# Patient Record
Sex: Female | Born: 1965 | Race: White | Hispanic: No | Marital: Married | Smoking: Former smoker
Health system: Southern US, Community
[De-identification: ages and names within clinical notes are randomized; demographics above are authoritative.]

## PROBLEM LIST (undated history)

## (undated) DIAGNOSIS — F419 Anxiety disorder, unspecified: Secondary | ICD-10-CM

## (undated) HISTORY — DX: Anxiety disorder, unspecified: F41.9

---

## 1998-11-28 ENCOUNTER — Other Ambulatory Visit: Admission: RE | Admit: 1998-11-28 | Discharge: 1998-11-28 | Payer: Self-pay | Admitting: Obstetrics and Gynecology

## 1999-07-13 ENCOUNTER — Inpatient Hospital Stay (HOSPITAL_COMMUNITY): Admission: AD | Admit: 1999-07-13 | Discharge: 1999-07-15 | Payer: Self-pay | Admitting: Obstetrics and Gynecology

## 1999-08-09 ENCOUNTER — Encounter: Admission: RE | Admit: 1999-08-09 | Discharge: 1999-11-07 | Payer: Self-pay | Admitting: Obstetrics and Gynecology

## 1999-12-28 ENCOUNTER — Other Ambulatory Visit: Admission: RE | Admit: 1999-12-28 | Discharge: 1999-12-28 | Payer: Self-pay | Admitting: Obstetrics and Gynecology

## 2001-01-06 ENCOUNTER — Other Ambulatory Visit: Admission: RE | Admit: 2001-01-06 | Discharge: 2001-01-06 | Payer: Self-pay | Admitting: Obstetrics and Gynecology

## 2002-01-08 ENCOUNTER — Other Ambulatory Visit: Admission: RE | Admit: 2002-01-08 | Discharge: 2002-01-08 | Payer: Self-pay | Admitting: Obstetrics and Gynecology

## 2003-03-03 ENCOUNTER — Ambulatory Visit (HOSPITAL_COMMUNITY): Admission: RE | Admit: 2003-03-03 | Discharge: 2003-03-03 | Payer: Self-pay | Admitting: Orthopedic Surgery

## 2003-09-15 ENCOUNTER — Other Ambulatory Visit: Admission: RE | Admit: 2003-09-15 | Discharge: 2003-09-15 | Payer: Self-pay | Admitting: Obstetrics and Gynecology

## 2004-07-27 ENCOUNTER — Ambulatory Visit: Payer: Self-pay | Admitting: Gastroenterology

## 2004-08-14 ENCOUNTER — Ambulatory Visit: Payer: Self-pay | Admitting: Gastroenterology

## 2004-08-15 ENCOUNTER — Encounter: Admission: RE | Admit: 2004-08-15 | Discharge: 2004-08-15 | Payer: Self-pay | Admitting: Gastroenterology

## 2005-01-01 ENCOUNTER — Other Ambulatory Visit: Admission: RE | Admit: 2005-01-01 | Discharge: 2005-01-01 | Payer: Self-pay | Admitting: Obstetrics and Gynecology

## 2007-01-13 ENCOUNTER — Encounter (INDEPENDENT_AMBULATORY_CARE_PROVIDER_SITE_OTHER): Payer: Self-pay | Admitting: Obstetrics and Gynecology

## 2007-01-14 ENCOUNTER — Inpatient Hospital Stay (HOSPITAL_COMMUNITY): Admission: RE | Admit: 2007-01-14 | Discharge: 2007-01-15 | Payer: Self-pay | Admitting: Obstetrics and Gynecology

## 2010-11-27 NOTE — Op Note (Signed)
NAME:  Tanya Hall, Tanya Hall NO.:  [id number]   MEDICAL RECORD NO.:  [id number]          PATIENT TYPE:  AMB   LOCATION:  SDC                           FACILITY:  [facility]   PHYSICIAN:  Miguel Aschoff, M.D.       DATE OF BIRTH:  09-02-1965   DATE OF PROCEDURE:  01/13/2007  DATE OF DISCHARGE:                               OPERATIVE REPORT   PREOPERATIVE DIAGNOSIS:  Complex left adnexal mass, chronic pelvic pain,  dysmenorrhea.   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   PROCEDURE:  Diagnostic laparoscopy, left salpingo-oophorectomy, cautery  of endometriosis, laser uterosacral nerve ablation.   SURGEON:  Miguel Aschoff, M.D.   ASSISTANT:  Ilda Mori, M.D.   ANESTHESIA:  General.   COMPLICATIONS:  None.   JUSTIFICATION:  The patient is a 45 year old white female with a history  of persistent pelvic pain and increasing dysmenorrhea.  Ultrasound  revealed a complex adnexal mass involving the left ovary. This has been  observed and has been persistent and the patient's pelvic pain has also  persisted. Due to these findings, she presents now to undergo  laparoscopy and laparotomy as indicated in an effort to determine the  cause of the patient's pelvic pain and complex left adnexal mass.  The  risks and benefits of the procedure were discussed with the patient.   DESCRIPTION OF PROCEDURE:  The patient was taken to the operating room  and placed in a supine position.  General anesthesia was administered  without difficulty.  She was then placed in the dorsal lithotomy  position, prepped and draped in the usual sterile fashion.  A Foley  catheter was inserted.  A Hulka tenaculum was then placed through the  cervix and held.  Attention was then directed to the umbilicus where a  small infraumbilical incision was made.  A Veress needle was inserted  and the abdomen was insufflated with 3 liters CO2.  Following  insufflation, the trocar to  the laparoscope was placed followed by the  laparoscope, itself.  Then, under direct visualization, an 11 mm port  was established in the left lower quadrant and a 5 mm port was  established in the right lower quadrant.  Systematic inspection of the  pelvic organs showed normal anterior bladder peritoneum.  The round  ligaments were unremarkable.  The uterus was noted to be mid position,  normal size and shape.  The tubes were identified and were normal along  their course.  The fimbriae were fine and delicate.  The right ovary was  inspected and appeared to be normal size.  There appeared to be two  powder burn lesions on the surface of the right ovary.  The left tube  was noted to be normal along its course with normal fimbria.  The left  ovary, however, was noted to be adherent to the lateral pelvic sidewall  and enlarged and the enlargement of the ovary was found to be secondary  to endometriomas involving the ovary.  It was possible to free the ovary  off the lateral pelvic sidewall, the ovary  was then elevated and the  infundibulopelvic ligament was identified as was the ureter. Then, using  the gyrus unit, it was possible to dissect the ovary medially towards  the uterus along the meso-ovarium ligament.  The infundibulopelvic  ligament was identified, cauterized, and cut.  Then, the meso-ovarian  ligament was cauterized and cut in a similar fashion. The mesosalpinx  was also incorporated, cauterized and cut, and the specimen dissected  toward the cornual portion of the uterus.  Then, the pedicle holding the  residual portion of the tube and ovary was grasped with the gyrus unit,  cauterized, cut, freeing the specimen.  At this point, the tube and  ovaries were placed in the anterior cul-de-sac.  Because of the  patient's dysmenorrhea, it was elected to laser ablate the uterosacral  ligaments in an effort to relieve the dysmenorrhea. Using the YAG laser  at 15 watts of power and a  GRP6 tip, the uterosacral ligaments were  partially transected.  After this was done, the implants of  endometriosis on the right ovary were treated with laser without  difficulty.  At this point, the abdomen was irrigated with saline via  the Nezhat suction irrigator.  There appeared to be good hemostasis.  There was one small area of oozing noted where the ovary was adherent to  the lateral pelvic sidewall.  This was adjacent to where the ureter was  located and this was brought under control by placing Surgicel on this  location.  At this point, the specimen was placed in an EndoCatch bag,  brought out through the left lower quadrant incision.  The fascial  incision was extended and the specimen was removed en toto.  After this  was done, lap counts and instrument counts were taken and found to be  correct.  All instruments were removed.  The fascia in the left lower  quadrant incision was then grasped with Kocher clamps and then closed  using running interlocking 0 Vicryl suture.  The subcutaneous tissue in  this area was closed using interrupted 0 Vicryl suture and then all skin  incisions were closed using subcuticular 4-0 Vicryl.  The port sites  were then injected with 0.25% Marcaine.  The estimated blood loss was  approximately 50 mL.  The patient tolerated the procedure well and went  to the recovery room in satisfactory condition.      Miguel Aschoff, M.D.  Electronically Signed     AR/MEDQ  D:  01/13/2007  T:  01/13/2007  Job:  [id number]

## 2010-11-30 NOTE — Discharge Summary (Signed)
Tanya Hall, THREATS NO.:  [id number]   MEDICAL RECORD NO.:  [id number]          PATIENT TYPE:  INP   LOCATION:  9319                          FACILITY:  [facility]   PHYSICIAN:  Miguel Aschoff, M.D.       DATE OF BIRTH:  May 04, 1966   DATE OF ADMISSION:  01/13/2007  DATE OF DISCHARGE:  01/15/2007                               DISCHARGE SUMMARY   ADMISSION DIAGNOSIS:  Complex left adnexal mass, chronic pelvic pain,  dysmenorrhea.   FINAL DIAGNOSIS:  Pelvic endometriosis with an endometrioma involving  the left ovary.   OPERATIONS AND PROCEDURES:  Laparoscopy with left salpingo-oophorectomy,  quartered endometriosis and laser uterosacral nerve ablation.   BRIEF HISTORY:  The patient is a 45 year old white female with  persistent pelvic pain and dysmenorrhea who was found to have a  persistent complex left adnexal mass involving the left ovary.  Because  of the ultrasound features of this mass in the persistence and  associated symptoms of pelvic pain and dysmenorrhea, the diagnostic  options were discussed with the patient and the patient agreed to  undergo laparoscopy with left salpingo-oophorectomy.  For suspected  endometrioma.   HOSPITAL COURSE:  The patient had preoperative studies obtained.  This  include admission hemoglobin 14.4, white count of 5400.  She was taken  to the operating room on January 13, 2007 where under general anesthesia,  diagnostic laparoscopy was carried out without difficulty and left  salpingo-oophorectomy was carried out with removal of a endometriotic  cyst involving the left ovary.  Surgery was  carried out without  difficulty.  Residual implants of endometriosis were treated with  cautery and the patient underwent laser uterosacral nerve ablation in an  effort to try to reduce her dysmenorrhea.  Postoperative course was  essentially uncomplicated.  She did require IV pain medication.  However, by January 15, 2007 she was in satisfactory  condition, ambulating  well and she began a regular diet.  She was felt to be in satisfactory  condition to be discharged home.   MEDICATIONS:  For home included Darvocet N 100 one every 4 hours as  needed for pain, Motrin 800 mg one p.o. t.i.d. p.r.n. pain.   She was sent home on a regular diet, instructed no heavy lifting, place  nothing in vagina, to call for any problems such as fever, pain or heavy  bleeding.  The patient is scheduled to be seen back in 4 weeks for  follow-up examination.   FINAL DIAGNOSIS:  Pelvic endometriosis with endometrioma of left ovary.   CONDITION ON DISCHARGE:  Improved.   REVIEW OF SYSTEMS:  Thank you      Miguel Aschoff, M.D.  Electronically Signed     AR/MEDQ  D:  03/07/2007  T:  03/08/2007  Job:  [id number]

## 2010-11-30 NOTE — Op Note (Signed)
NAME:  Tanya Hall, Tanya Hall                       ACCOUNT NO.:  [id number]   MEDICAL RECORD NO.:  [id number]                   PATIENT TYPE:  AMB   LOCATION:  DAY                                  FACILITY:  [facility]   PHYSICIAN:  Marlowe Kays, M.D.               DATE OF BIRTH:  1965-12-19   DATE OF PROCEDURE:  03/03/2003  DATE OF DISCHARGE:                                 OPERATIVE REPORT   PREOPERATIVE DIAGNOSES:  Torn medial and possibly torn lateral meniscus,  left knee.   POSTOPERATIVE DIAGNOSES:  Torn medial and lateral menisci, left knee.   OPERATION:  Left knee arthroscopy with partial medial and lateral  meniscectomy.   SURGEON:  Marlowe Kays, M.D.   ASSISTANT:  Nurse.   ANESTHESIA:  General.   PATHOLOGY AND JUSTIFICATION FOR PROCEDURE:  She first developed pain and  swelling in the left knee in early June. MRI was obtained on June 26 which  demonstrated significant posterior horn tear of the medial meniscus as well  as a radial or free edge tear of the lateral meniscus, small joint effusion  and a moderate Baker cyst. Due to persistent pain and swelling, she is here  today for surgical correction.   DESCRIPTION OF PROCEDURE:  Satisfactory general anesthesia, pneumatic  tourniquet, thigh stabilizer, left knee was prepped with Duraprep, draped in  a sterile field, superior and medial saline inflow. First through an  anterior lateral portal, the medial compartment of the knee joint was  evaluated. The anterior portion of her knee looked normal but in the  posterior she had extensive shredding of the entire posterior third of the  medial meniscus with a portion of the fragments able to migrate into the  joint. I used a combination of baskets mixed with a 3.5 shaver to resect the  meniscus back to a stable rim and shave it down until smooth. At the  posterior curve, the tear was severe enough that it went all way back to the  synovial margin. When I felt that the  pathology here had been corrected and  should also be noted that she had minimal wear of her medial joint surfaces.  I looked up in the medial gutter and suprapatellar area and her patella  looked normal. I then reversed portals, the ACL was intact with some mild  synovitis which I resected. She had several areas of tear in the lateral  meniscus with one being mid posteriorly as pictured on the MRI and two  others in the anterior mid third area. The lateral meniscus was shaved down  until smooth. Final pictures were taken, joint surfaces looked unremarkable.  The knee joint was irrigated until clear and all fluid possible removed. The  two anterior portals were closed with 4-0 nylon, 20 mL of 0.5% Marcaine with  adrenaline, 4 mg of morphine were then instilled through the inflow  apparatus which was removed, this portal closed  with 4-0 nylon as well.  Betadine Adaptic dry sterile dressing were applied, tourniquet was released.  She tolerated the procedure well and was taken to the recovery room in  satisfactory condition with no known complications.                                               Marlowe Kays, M.D.   JA/MEDQ  D:  03/03/2003  T:  03/04/2003  Job:  [id number]

## 2011-03-08 ENCOUNTER — Other Ambulatory Visit: Payer: Self-pay | Admitting: Obstetrics and Gynecology

## 2011-04-30 LAB — CBC
HCT: 41.7
Hemoglobin: 14.4
MCHC: 34.5
MCV: 93.2
RBC: 3.28 — ABNORMAL LOW
RBC: 4.47
RDW: 13.2
WBC: 4.9

## 2012-06-18 ENCOUNTER — Other Ambulatory Visit: Payer: Self-pay | Admitting: Obstetrics and Gynecology

## 2012-06-18 DIAGNOSIS — R928 Other abnormal and inconclusive findings on diagnostic imaging of breast: Secondary | ICD-10-CM

## 2012-06-26 ENCOUNTER — Ambulatory Visit
Admission: RE | Admit: 2012-06-26 | Discharge: 2012-06-26 | Disposition: A | Discharge status: Home / Self Care | Payer: Managed Care, Other (non HMO) | Source: Ambulatory Visit | Attending: Obstetrics and Gynecology | Admitting: Obstetrics and Gynecology

## 2012-06-26 ENCOUNTER — Other Ambulatory Visit: Payer: Self-pay | Admitting: Obstetrics and Gynecology

## 2012-06-26 DIAGNOSIS — R928 Other abnormal and inconclusive findings on diagnostic imaging of breast: Secondary | ICD-10-CM

## 2013-11-27 ENCOUNTER — Encounter (HOSPITAL_COMMUNITY): Payer: Self-pay | Admitting: Emergency Medicine

## 2013-11-27 ENCOUNTER — Emergency Department (HOSPITAL_COMMUNITY)
Admission: EM | Admit: 2013-11-27 | Discharge: 2013-11-27 | Discharge status: Against Medical Advice | Payer: Managed Care, Other (non HMO) | Attending: Emergency Medicine | Admitting: Emergency Medicine

## 2013-11-27 DIAGNOSIS — F17200 Nicotine dependence, unspecified, uncomplicated: Secondary | ICD-10-CM | POA: Insufficient documentation

## 2013-11-27 DIAGNOSIS — F1010 Alcohol abuse, uncomplicated: Secondary | ICD-10-CM | POA: Insufficient documentation

## 2013-11-27 NOTE — ED Notes (Signed)
[facility].  Pt states that she drank a pint right before she came in.  [facility] so she came here on the fear that she would get the shakes or have a seizure.  Pt last drink was right before she arrived.

## 2013-11-27 NOTE — ED Notes (Signed)
Pt left againt medical advise.  Pt stated that she was going to grab a taxi.  Pt refused to sign the AMA form or to have her vitals check. Pt was told to return to the ED if any symptoms present themselves

## 2013-11-28 ENCOUNTER — Encounter (HOSPITAL_COMMUNITY): Payer: Self-pay | Admitting: Emergency Medicine

## 2013-11-28 ENCOUNTER — Emergency Department (HOSPITAL_COMMUNITY)
Admission: EM | Admit: 2013-11-28 | Discharge: 2013-11-28 | Disposition: A | Discharge status: Home / Self Care | Payer: Managed Care, Other (non HMO) | Attending: Emergency Medicine | Admitting: Emergency Medicine

## 2013-11-28 DIAGNOSIS — Z3202 Encounter for pregnancy test, result negative: Secondary | ICD-10-CM | POA: Insufficient documentation

## 2013-11-28 DIAGNOSIS — F1020 Alcohol dependence, uncomplicated: Secondary | ICD-10-CM | POA: Insufficient documentation

## 2013-11-28 DIAGNOSIS — F17200 Nicotine dependence, unspecified, uncomplicated: Secondary | ICD-10-CM | POA: Insufficient documentation

## 2013-11-28 DIAGNOSIS — F1010 Alcohol abuse, uncomplicated: Secondary | ICD-10-CM

## 2013-11-28 DIAGNOSIS — Z79899 Other long term (current) drug therapy: Secondary | ICD-10-CM | POA: Insufficient documentation

## 2013-11-28 LAB — RAPID URINE DRUG SCREEN, HOSP PERFORMED
Amphetamines: NOT DETECTED
BARBITURATES: NOT DETECTED
Benzodiazepines: NOT DETECTED
COCAINE: NOT DETECTED
OPIATES: NOT DETECTED
Tetrahydrocannabinol: NOT DETECTED

## 2013-11-28 LAB — COMPREHENSIVE METABOLIC PANEL
ALBUMIN: 4 g/dL (ref 3.5–5.2)
ALT: 18 U/L (ref 0–35)
AST: 30 U/L (ref 0–37)
Alkaline Phosphatase: 62 U/L (ref 39–117)
BUN: 8 mg/dL (ref 6–23)
CALCIUM: 9 mg/dL (ref 8.4–10.5)
CO2: 24 meq/L (ref 19–32)
CREATININE: 0.78 mg/dL (ref 0.50–1.10)
Chloride: 102 mEq/L (ref 96–112)
GFR calc Af Amer: >90 mL/min (ref >90)
Glucose, Bld: 92 mg/dL (ref 70–99)
Potassium: 3.7 mEq/L (ref 3.7–5.3)
Sodium: 143 mEq/L (ref 137–147)
Total Bilirubin: 0.2 mg/dL — ABNORMAL LOW (ref 0.3–1.2)
Total Protein: 7 g/dL (ref 6.0–8.3)

## 2013-11-28 LAB — CBC WITH DIFFERENTIAL/PLATELET
BASOS PCT: 0 % (ref 0–1)
Basophils Absolute: 0 10*3/uL (ref 0.0–0.1)
EOS PCT: 1 % (ref 0–5)
Eosinophils Absolute: 0.1 10*3/uL (ref 0.0–0.7)
HEMATOCRIT: 42.6 % (ref 36.0–46.0)
Hemoglobin: 15.1 g/dL — ABNORMAL HIGH (ref 12.0–15.0)
Lymphocytes Relative: 54 % — ABNORMAL HIGH (ref 12–46)
Lymphs Abs: 2.9 10*3/uL (ref 0.7–4.0)
MCH: 35 pg — ABNORMAL HIGH (ref 26.0–34.0)
MCHC: 35.4 g/dL (ref 30.0–36.0)
MCV: 98.6 fL (ref 78.0–100.0)
MONO ABS: 0.4 10*3/uL (ref 0.1–1.0)
Monocytes Relative: 7 % (ref 3–12)
Neutro Abs: 2.1 10*3/uL (ref 1.7–7.7)
Neutrophils Relative %: 38 % — ABNORMAL LOW (ref 43–77)
Platelets: 263 10*3/uL (ref 150–400)
RBC: 4.32 MIL/uL (ref 3.87–5.11)
RDW: 12.6 % (ref 11.5–15.5)
WBC: 5.4 10*3/uL (ref 4.0–10.5)

## 2013-11-28 LAB — ETHANOL: Alcohol, Ethyl (B): 246 mg/dL — ABNORMAL HIGH (ref 0–11)

## 2013-11-28 LAB — POC URINE PREG, ED: Preg Test, Ur: NEGATIVE

## 2013-11-28 MED ORDER — VITAMIN B-1 100 MG PO TABS
100.0000 mg | ORAL_TABLET | Freq: Every day | ORAL | Status: DC
  Administered 2013-11-28: 100 mg via ORAL
  Filled 2013-11-28: qty 1

## 2013-11-28 MED ORDER — ALUM & MAG HYDROXIDE-SIMETH 200-200-20 MG/5ML PO SUSP: 30.0000 mL | ORAL | Status: DC | PRN

## 2013-11-28 MED ORDER — THIAMINE HCL 100 MG/ML IJ SOLN: 100.0000 mg | Freq: Every day | INTRAMUSCULAR | Status: DC

## 2013-11-28 MED ORDER — LORAZEPAM 1 MG PO TABS: 0.0000 mg | ORAL_TABLET | Freq: Four times a day (QID) | ORAL | Status: DC

## 2013-11-28 MED ORDER — IBUPROFEN 200 MG PO TABS
600.0000 mg | ORAL_TABLET | Freq: Three times a day (TID) | ORAL | Status: DC | PRN
  Administered 2013-11-28: 600 mg via ORAL
  Filled 2013-11-28: qty 3

## 2013-11-28 MED ORDER — CITALOPRAM HYDROBROMIDE 40 MG PO TABS
40.0000 mg | ORAL_TABLET | Freq: Every day | ORAL | Status: DC
  Administered 2013-11-28: 40 mg via ORAL
  Filled 2013-11-28 (×2): qty 1

## 2013-11-28 MED ORDER — LORAZEPAM 1 MG PO TABS: 0.0000 mg | ORAL_TABLET | Freq: Two times a day (BID) | ORAL | Status: DC

## 2013-11-28 MED ORDER — ONDANSETRON HCL 4 MG PO TABS: 4.0000 mg | ORAL_TABLET | Freq: Three times a day (TID) | ORAL | Status: DC | PRN

## 2013-11-28 MED ORDER — NICOTINE 21 MG/24HR TD PT24
21.0000 mg | MEDICATED_PATCH | Freq: Every day | TRANSDERMAL | Status: DC
  Filled 2013-11-28: qty 1

## 2013-11-28 NOTE — ED Notes (Signed)
Up to the bathroom 

## 2013-11-28 NOTE — ED Provider Notes (Signed)
CSN: [id number]     Arrival date & time 11/28/13  0010 History   First MD Initiated Contact with Patient 11/28/13 0054     Chief Complaint  Patient presents with  . Detox      (Consider location/radiation/quality/duration/timing/severity/associated sxs/prior Treatment) HPI History provided by pt.   Pt presents w/ request for alcohol detox.  Has been abusing alcohol for ~2 years.  Drinks 1L of liquor a day.  If she goes more than 12 hours without a drink, she develop tremors.  She recently joined AA and has been trying to gradually decrease her consumption, but she is scared because this evening, 24hrs after her last drink, she developed trembling in her forehead that she suspected was seizure activity.  She had a drink at 9pm which she believes resolved the trembling.  She denies headache, diaphoresis, tachycardia or any other associated sx.  Occasionally smokes marijuana.  Compliant w/ her anti-depressants and otherwise has no PMH.  Denies SI/HI. History reviewed. No pertinent past medical history. History reviewed. No pertinent past surgical history. History reviewed. No pertinent family history. History  Substance Use Topics  . Smoking status: Current Every Day Smoker -- 1.00 packs/day    Types: Cigarettes  . Smokeless tobacco: Not on file  . Alcohol Use: Yes     Comment: pint liqueur a day   OB History   Grav Para Term Preterm Abortions TAB SAB Ect Mult Living                 Review of Systems  All other systems reviewed and are negative.     Allergies  Review of patient's allergies indicates no known allergies.  Home Medications   Prior to Admission medications   Medication Sig Start Date End Date Taking? Authorizing Provider  citalopram (CELEXA) 40 MG tablet Take 40 mg by mouth daily. 10/25/13  Yes Historical Provider, MD  ibuprofen (ADVIL,MOTRIN) 200 MG tablet Take 400 mg by mouth every 6 (six) hours as needed (for pain).   Yes Historical Provider, MD   BP 126/82   Pulse 84  Temp(Src) 98.2 F (36.8 C) (Oral)  Resp 16  Ht 5' 3.5" (1.613 m)  Wt 160 lb (72.576 kg)  BMI 27.89 kg/m2  SpO2 99%  LMP 11/17/2013 Physical Exam  Nursing note and vitals reviewed. Constitutional: She is oriented to person, place, and time. She appears well-developed and well-nourished. No distress.  HENT:  Head: Normocephalic and atraumatic.  Mouth/Throat: Oropharynx is clear and moist.  Eyes:  Normal appearance  Neck: Normal range of motion.  Cardiovascular: Normal rate and regular rhythm.   Pulmonary/Chest: Effort normal and breath sounds normal. No respiratory distress.  Musculoskeletal: Normal range of motion.  Neurological: She is alert and oriented to person, place, and time.  CN 3-12 intact.  No sensory deficits.  5/5 and equal upper and lower extremity strength.  No past pointing.  No tremor.  Skin: Skin is warm and dry. No rash noted.  Psychiatric: She has a normal mood and affect. Her behavior is normal.    ED Course  Procedures (including critical care time) Labs Review Labs Reviewed - No data to display  Imaging Review No results found.   EKG Interpretation None      MDM   Final diagnoses:  Alcoholism    47yo F w/ depression and alcoholism presents w/ request for alcohol detox.  Was in ER earlier this evening as well but eloped d/t anger over not being seen in a  more timely manner.  No signs of active withdrawal on current exam.  Labs are pending.  CIWA protocol initiated and holding orders written.  2:12 AM   Labs unremarkable w/ exception of alcohol level which is 246.  She has been evaluated by [facility] and they are looking for placement.  3:18 AM     Otilio Miuatherine E Ayaz Sondgeroth, PA-C 11/28/13 [phone]

## 2013-11-28 NOTE — Progress Notes (Signed)
Writer informed the ER MD Dr. Effie ShyWentz that the patient has declined [facility] and has requested discharge.  Writer provided resources for substance abuse IOP.

## 2013-11-28 NOTE — ED Notes (Signed)
Patient concerned if she is going to be placed in a detox program and reports that she would be willing to go to IOP if they are not able to place her. Will inform [facility]

## 2013-11-28 NOTE — ED Notes (Signed)
Pt reports that she wants to detox from ETOH, reports last drink approximately 3 hours ago. States that she drinks a pint of liqueur a day. Pt left earlier AMA and states she returned because she feels like she is going to have a seizure, no hx of the same. Pt a&o x4, NAD noted, calm and cooperative at this time.

## 2013-11-28 NOTE — ED Provider Notes (Signed)
18:20- she told providers that she wanted to be discharged for outpatient treatment. At this time she is eating dinner, calm, comfortable, and states that she feels better. There is no tremor, and her mentation is normal.  Flint MelterElliott L Shandee Jergens, MD 11/28/13 [phone]

## 2013-11-28 NOTE — Progress Notes (Signed)
[facility] and spoke with Jasmine DecemberSharon, stated that Referral has not been received and asked that it be re-faxed.  Informed her that pt does have [employer] insurance and asked if it could still be reviewed today for possible admission d/t insurance reviews usually only being done Monday through Friday.  Stated that she would call administration and run by them this weekend for possible admit.  Referral re-faxed.   Tomi BambergerMariya Mehdi Gironda Disposition MHT

## 2013-11-28 NOTE — Discharge Instructions (Signed)
Followup, with one of the treatment programs, for help with her alcoholism. Do not drink any alcohol. Try to eat 3 meals a day, and drink plenty of water.     Alcohol Problems Most adults who drink alcohol drink in moderation (not a lot) are at low risk for developing problems related to their drinking. However, all drinkers, including low-risk drinkers, should know about the health risks connected with drinking alcohol. RECOMMENDATIONS FOR LOW-RISK DRINKING  Drink in moderation. Moderate drinking is defined as follows:   Men - no more than 2 drinks per day.  Nonpregnant women - no more than 1 drink per day.  Over age 48 - no more than 1 drink per day. A standard drink is 12 grams of pure alcohol, which is equal to a 12 ounce bottle of beer or wine cooler, a 5 ounce glass of wine, or 1.5 ounces of distilled spirits (such as whiskey, brandy, vodka, or rum).  ABSTAIN FROM (DO NOT DRINK) ALCOHOL:  When pregnant or considering pregnancy.  When taking a medication that interacts with alcohol.  If you are alcohol dependent.  A medical condition that prohibits drinking alcohol (such as ulcer, liver disease, or heart disease). DISCUSS WITH YOUR CAREGIVER:  If you are at risk for coronary heart disease, discuss the potential benefits and risks of alcohol use: Light to moderate drinking is associated with lower rates of coronary heart disease in certain populations (for example, men over age 48 and postmenopausal women). Infrequent or nondrinkers are advised not to begin light to moderate drinking to reduce the risk of coronary heart disease so as to avoid creating an alcohol-related problem. Similar protective effects can likely be gained through proper diet and exercise.  Women and the elderly have smaller amounts of body water than men. As a result women and the elderly achieve a higher blood alcohol concentration after drinking the same amount of alcohol.  Exposing a fetus to alcohol can  cause a broad range of birth defects referred to as Fetal Alcohol Syndrome (FAS) or Alcohol-Related Birth Defects (ARBD). Although FAS/ARBD is connected with excessive alcohol consumption during pregnancy, studies also have reported neurobehavioral problems in infants born to mothers reporting drinking an average of 1 drink per day during pregnancy.  Heavier drinking (the consumption of more than 4 drinks per occasion by men and more than 3 drinks per occasion by women) impairs learning (cognitive) and psychomotor functions and increases the risk of alcohol-related problems, including accidents and injuries. CAGE QUESTIONS:   Have you ever felt that you should Cut down on your drinking?  Have people Annoyed you by criticizing your drinking?  Have you ever felt bad or Guilty about your drinking?  Have you ever had a drink first thing in the morning to steady your nerves or get rid of a hangover (Eye opener)? If you answered positively to any of these questions: You may be at risk for alcohol-related problems if alcohol consumption is:   Men: Greater than 14 drinks per week or more than 4 drinks per occasion.  Women: Greater than 7 drinks per week or more than 3 drinks per occasion. Do you or your family have a medical history of alcohol-related problems, such as:  Blackouts.  Sexual dysfunction.  Depression.  Trauma.  Liver dysfunction.  Sleep disorders.  Hypertension.  Chronic abdominal pain.  Has your drinking ever caused you problems, such as problems with your family, problems with your work (or school) performance, or accidents/injuries?  Do you have  a compulsion to drink or a preoccupation with drinking?  Do you have poor control or are you unable to stop drinking once you have started?  Do you have to drink to avoid withdrawal symptoms?  Do you have problems with withdrawal such as tremors, nausea, sweats, or mood disturbances?  Does it take more alcohol than in the  past to get you high?  Do you feel a strong urge to drink?  Do you change your plans so that you can have a drink?  Do you ever drink in the morning to relieve the shakes or a hangover? If you have answered a number of the previous questions positively, it may be time for you to talk to your caregivers, family, and friends and see if they think you have a problem. Alcoholism is a chemical dependency that keeps getting worse and will eventually destroy your health and relationships. Many alcoholics end up dead, impoverished, or in prison. This is often the end result of all chemical dependency.  Do not be discouraged if you are not ready to take action immediately.  Decisions to change behavior often involve up and down desires to change and feeling like you cannot decide.  Try to think more seriously about your drinking behavior.  Think of the reasons to quit. WHERE TO GO FOR ADDITIONAL INFORMATION   The National Institute on Alcohol Abuse and Alcoholism (NIAAA) BasicStudents.dk  [employer] on Alcoholism and Drug Dependence (NCADD) www.ncadd.org  American Society of Addiction Medicine (ASAM) RoyalDiary.gl  Document Released: 07/01/2005 Document Revised: 09/23/2011 Document Reviewed: 02/17/2008 [facility] Patient Information 2014 [place], [place].  Alcohol and Nutrition Nutrition serves two purposes. It provides energy. It also maintains body structure and function. Food supplies energy. It also provides the building blocks needed to replace worn or damaged cells. Alcoholics often eat poorly. This limits their supply of essential nutrients. This affects energy supply and structure maintenance. Alcohol also affects the body's nutrients in:  Digestion.  Storage.  Using and getting rid of waste products. IMPAIRMENT OF NUTRIENT DIGESTION AND UTILIZATION   Once ingested, food must be broken down into small components (digested). Then it is available for energy. It helps maintain  body structure and function. Digestion begins in the mouth. It continues in the stomach and intestines, with help from the pancreas. The nutrients from digested food are absorbed from the intestines into the blood. Then they are carried to the liver. The liver prepares nutrients for:  Immediate use.  Storage and future use.  Alcohol inhibits the breakdown of nutrients into usable molecules.  It decreases secretion of digestive enzymes from the pancreas.  Alcohol impairs nutrient absorption by damaging the cells lining the stomach and intestines.  It also interferes with moving some nutrients into the blood.  In addition, nutritional deficiencies themselves may lead to further absorption problems.  For example, folate deficiency changes the cells that line the small intestine. This impairs how water is absorbed. It also affects absorbed nutrients. These include glucose, sodium, and additional folate.  Even if nutrients are digested and absorbed, alcohol can prevent them from being fully used. It changes their transport, storage, and excretion. Impaired utilization of nutrients by alcoholics is indicated by:  Decreased liver stores of vitamins, such as vitamin A.  Increased excretion of nutrients such as fat. ALCOHOL AND ENERGY SUPPLY   Three basic nutritional components found in food are:  Carbohydrates.  Proteins.  Fats.  These are used as energy. Some alcoholics take in as much as  50% of their total daily calories from alcohol. They often neglect important foods.  Even when enough food is eaten, alcohol can impair the ways the body controls blood sugar (glucose) levels. It may either increase or decrease blood sugar.  In non-diabetic alcoholics, increased blood sugar (hyperglycemia) is caused by poor insulin secretion. It is usually temporary.  Decreased blood sugar (hypoglycemia) can cause serious injury even if this condition is short-lived. Low blood sugar can happen when a  fasting or malnourished person drinks alcohol. When there is no food to supply energy, stored sugar is used up. The products of alcohol inhibit forming glucose from other compounds such as amino acids. As a result, alcohol causes the brain and other body tissue to lack glucose. It is needed for energy and function.  Alcohol is an energy source. But how the body processes and uses the energy from alcohol is complex. Also, when alcohol is substituted for carbohydrates, subjects tend to lose weight. This indicates that they get less energy from alcohol than from food. ALCOHOL - MAINTAINING CELL STRUCTURE AND FUNCTION  Structure Cells are made mostly of protein. So an adequate protein diet is important for maintaining cell structure. This is especially true if cells are being damaged. Research indicates that alcohol affects protein nutrition by causing impaired:  Digestion of proteins to amino acids.  Processing of amino acids by the small intestine and liver.  Synthesis of proteins from amino acids.  Protein secretion by the liver. Function Nutrients are essential for the body to function well. They provide the tools that the body needs to work well:   Proteins.  Vitamins.  Minerals. Alcohol can disrupt body function. It may cause nutrient deficiencies. And it may interfere with the way nutrients are processed. Vitamins  Vitamins are essential to maintain growth and normal metabolism. They regulate many of the body`s processes. Chronic heavy drinking causes deficiencies in many vitamins. This is caused by eating less. And, in some cases, vitamins may be poorly absorbed. For example, alcohol inhibits fat absorption. It impairs how the vitamins A, E, and D are normally absorbed along with dietary fats. Not enough vitamin A may cause night blindness. Not enough vitamin D may cause softening of the bones.  Some alcoholics lack vitamins A, C, D, E, K, and the B vitamins. These are all involved in  wound healing and cell maintenance. In particular, because vitamin K is necessary for blood clotting, lacking that vitamin can cause delayed clotting. The result is excess bleeding. Lacking other vitamins involved in brain function may cause severe neurological damage. Minerals Deficiencies of minerals such as calcium, magnesium, iron, and zinc are common in alcoholics. The alcohol itself does not seem to affect how these minerals are absorbed. Rather, they seem to occur secondary to other alcohol-related problems, such as:  Less calcium absorbed.  Not enough magnesium.  More urinary excretion.  Vomiting.  Diarrhea.  Not enough iron due to gastrointestinal bleeding.  Not enough zinc or losses related to other nutrient deficiencies.  Mineral deficiencies can cause a variety of medical consequences. These range from calcium-related bone disease to zinc-related night blindness and skin lesions. ALCOHOL, MALNUTRITION, AND MEDICAL COMPLICATIONS  Liver Disease   Alcoholic liver damage is caused primarily by alcohol itself. But poor nutrition may increase the risk of alcohol-related liver damage. For example, nutrients normally found in the liver are known to be affected by drinking alcohol. These include carotenoids, which are the major sources of vitamin A, and vitamin E  compounds. Decreases in such nutrients may play some role in alcohol-related liver damage. Pancreatitis  Research suggests that malnutrition may increase the risk of developing alcoholic pancreatitis. Research suggests that a diet lacking in protein may increase alcohol's damaging effect on the pancreas. Brain  Nutritional deficiencies may have severe effects on brain function. These may be permanent. Specifically, thiamine deficiencies are often seen in alcoholics. They can cause severe neurological problems. These include:  Impaired movement.  Memory loss seen in Wernicke-Korsakoff syndrome. Pregnancy  Alcohol has  toxic effects on fetal development. It causes alcohol-related birth defects. They include fetal alcohol syndrome. Alcohol itself is toxic to the fetus. Also, the nutritional deficiency can affect how the fetus develops. That may compound the risk of developmental damage.  Nutritional needs during pregnancy are 10% to 30% greater than normal. Food intake can increase by as much as 140% to cover the needs of both mother and fetus. An alcoholic mother`s nutritional problems may adversely affect the nutrition of the fetus. And alcohol itself can also restrict nutrition flow to the fetus. NUTRITIONAL STATUS OF ALCOHOLICS  Techniques for assessing nutritional status include:  Taking body measurements to estimate fat reserves. They include:  Weight.  Height.  Mass.  Skin fold thickness.  Performing blood analysis to provide measurements of circulating:  Proteins.  Vitamins.  Minerals.  These techniques tend to be imprecise. For many nutrients, there is no clear "cut-off" point that would allow an accurate definition of deficiency. So assessing the nutritional status of alcoholics is limited by these techniques. Dietary status may provide information about the risk of developing nutritional problems. Dietary status is assessed by:  Taking patients' dietary histories.  Evaluating the amount and types of food they are eating.  It is difficult to determine what exact amount of alcohol begins to have damaging effects on nutrition. In general, moderate drinkers have 2 drinks or less per day. They seem to be at little risk for nutritional problems. Various medical disorders begin to appear at greater levels.  Research indicates that the majority of even the heaviest drinkers have few obvious nutritional deficiencies. Many alcoholics who are hospitalized for medical complications of their disease do have severe malnutrition. Alcoholics tend to eat poorly. Often they eat less than the amounts of food  necessary to provide enough:  Carbohydrates.  Protein.  Fat.  Vitamins A and C.  B vitamins.  Minerals like calcium and iron. Of major concern is alcohol's effect on digesting food and use of nutrients. It may shift a mildly malnourished person toward severe malnutrition. Document Released: 04/25/2005 Document Revised: 09/23/2011 Document Reviewed: 10/09/2005 [facility] Patient Information 2014 [place], [place].

## 2013-11-28 NOTE — ED Notes (Signed)
Up to the desk on the phone 

## 2013-11-28 NOTE — ED Notes (Signed)
Dr J and Josephine NP into see 

## 2013-11-28 NOTE — Progress Notes (Signed)
Calls placed on pt's behalf to facilities regarding detox tx:  [facility] Jerilynn [facility]vin [facility], referral [facility]ed [facility]- per Wylene MenLacey at capacity [facility]- no [facility] as primary dx [facility]- [facility] as primary [facility]- per Dorathy DaftKayla at [facility]- per Sam at [facility]- per [place] beds available but no primary SA [facility]- per Elijah Birkom at capacity MIssion at [facility]- [facility] only [facility] gero female [facility]- per [facility]ine at [facility]- beds available but do not take primary [facility]- per Carlis [facility] at capacity  *[facility] referral has been received and currently being reviewed   [facility] Maksymilian Mabey Disposition MHT

## 2013-11-28 NOTE — Progress Notes (Signed)
[facility]Sharon at [facility] referral has been forwarded to administration but has not heard anything back as of yet.  Stated they are running pt's insurance to see if there will be any co-pays.   Tomi BambergerMariya Kendric Sindelar Disposition MHT

## 2013-11-28 NOTE — ED Notes (Signed)
Pt has been accepted to [facility], but would rather do IOP, [facility] aware

## 2013-11-28 NOTE — Progress Notes (Signed)
MHT contacted the following detox inpatient treatment centers:  1)[facility]-does not accept insurance 2)[facility]-faxed referral 3)[facility]-does not have transportation back home 4)[facility]-no beds 5)[facility]-faxed referral 6)[facility]-faxed referral  Blain PaisMichelle L Liya Strollo, MHT/NS

## 2013-11-28 NOTE — Progress Notes (Signed)
Per J[facility]Sharon at [facility] pt had been [facility] and could come around 10 but pt has declined bed and decided to do outpt.  Will call [facility] and make them aware.   Tomi BambergerMariya Zayneb Baucum Disposition MHT

## 2013-11-28 NOTE — Progress Notes (Addendum)
Per, Dr. Shela CommonsJ patient meets criteria for inpatient hospitalization.    Per, Lynden Angathy at [facility] the referral is being reviewed and she will contact the writer after a decision has been made about the patient.   [facility] mail [facility].   Per, Karna ChristmasAdrianna at [facility], the fax was not received.  Writer re-faxed the referral.   [facility] Kay[facility].

## 2013-11-28 NOTE — ED Notes (Addendum)
Writen dc instructions reviewed w/ pt.  Pt encouraged to return/seek treatment for and si/hi/avh, and to follow up w/ IOP  As instructed.  Pt also encouraged to continue with AA meeting, and to contact her sponsor.  Pt verbalized understanding.  Pt ambulatory w/ mHt, belongings returned after leaving the unit.

## 2013-11-28 NOTE — ED Provider Notes (Signed)
Medical screening examination/treatment/procedure(s) were performed by non-physician practitioner and as supervising physician I was immediately available for consultation/collaboration.   EKG Interpretation None       Shon Batonourtney F Charbel Los, MD 11/28/13 [phone]

## 2013-11-28 NOTE — ED Notes (Signed)
Patient has decided that she wants to do IOP instead of being admitted, TTS aware

## 2013-11-28 NOTE — ED Notes (Signed)
NAD, asking about pending dc and how IOP will be arrainged
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
## 2013-11-28 NOTE — ED Notes (Signed)
Dr wentz into see 
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
## 2013-11-28 NOTE — BH Assessment (Addendum)
Spoke with Ruby Colaatherine Schinlever, PA-C who reported that pt was here earlier and left AMA. Pt has been abusing alcohol for several years and will drink about 1L a day. Around 9 pm pt had a trembling feeling and she felt as if she was having a seizure. Pt does not want to go to a 30 day detox program and would like a more short term program.  Assessment completed. Consulted with Alberteen SamFran Hobson, NP who agrees that pt meets inpatient detox criteria. Ruby Colaatherine Schinlever, PA-C has been notified of recommendations.  [facility]

## 2016-04-17 ENCOUNTER — Ambulatory Visit: Payer: Self-pay | Admitting: Physician Assistant

## 2016-06-11 ENCOUNTER — Ambulatory Visit: Payer: Self-pay | Admitting: Physician Assistant

## 2016-06-13 ENCOUNTER — Other Ambulatory Visit: Payer: Self-pay | Admitting: Obstetrics & Gynecology

## 2016-06-14 LAB — CYTOLOGY - PAP

## 2016-07-09 ENCOUNTER — Ambulatory Visit: Payer: Self-pay | Admitting: Physician Assistant

## 2017-06-05 ENCOUNTER — Emergency Department (HOSPITAL_BASED_OUTPATIENT_CLINIC_OR_DEPARTMENT_OTHER): Payer: 59

## 2017-06-05 ENCOUNTER — Emergency Department (HOSPITAL_BASED_OUTPATIENT_CLINIC_OR_DEPARTMENT_OTHER)
Admission: EM | Admit: 2017-06-05 | Discharge: 2017-06-05 | Disposition: A | Discharge status: Home / Self Care | Payer: 59 | Attending: Emergency Medicine | Admitting: Emergency Medicine

## 2017-06-05 ENCOUNTER — Encounter (HOSPITAL_BASED_OUTPATIENT_CLINIC_OR_DEPARTMENT_OTHER): Payer: Self-pay

## 2017-06-05 DIAGNOSIS — F17210 Nicotine dependence, cigarettes, uncomplicated: Secondary | ICD-10-CM | POA: Insufficient documentation

## 2017-06-05 DIAGNOSIS — W010XXA Fall on same level from slipping, tripping and stumbling without subsequent striking against object, initial encounter: Secondary | ICD-10-CM | POA: Insufficient documentation

## 2017-06-05 DIAGNOSIS — S99912A Unspecified injury of left ankle, initial encounter: Secondary | ICD-10-CM | POA: Diagnosis present

## 2017-06-05 DIAGNOSIS — Y929 Unspecified place or not applicable: Secondary | ICD-10-CM | POA: Insufficient documentation

## 2017-06-05 DIAGNOSIS — Y939 Activity, unspecified: Secondary | ICD-10-CM | POA: Insufficient documentation

## 2017-06-05 DIAGNOSIS — Y999 Unspecified external cause status: Secondary | ICD-10-CM | POA: Insufficient documentation

## 2017-06-05 DIAGNOSIS — S82832A Other fracture of upper and lower end of left fibula, initial encounter for closed fracture: Secondary | ICD-10-CM

## 2017-06-05 DIAGNOSIS — Z79899 Other long term (current) drug therapy: Secondary | ICD-10-CM | POA: Insufficient documentation

## 2017-06-05 NOTE — ED Triage Notes (Signed)
Pt c/o lt ankle pain, tripped over her cat last night

## 2017-06-05 NOTE — ED Provider Notes (Signed)
[facility] EMERGENCY DEPARTMENT Provider Note   CSN: [id number] Arrival date & time: 06/05/17  [id number]     History   Chief Complaint Chief Complaint  Patient presents with  . Ankle Pain    HPI Tanya Hall is a 51 y.o. female.  Patient is a 51 year old female who presents with ankle pain after twisting it.  This occurred last night.  She states that she tripped over her cat and twisted her left ankle.  She denies any other injuries from the fall.  She denies any numbness or weakness to the ankle.  She has been using ibuprofen for symptomatic relief.  This has improved the symptoms somewhat.  She describes the pain as a constant throbbing pain.      History reviewed. No pertinent past medical history.  There are no active problems to display for this patient.   History reviewed. No pertinent surgical history.  OB History    No data available       Home Medications    Prior to Admission medications   Medication Sig Start Date End Date Taking? Authorizing Provider  citalopram (CELEXA) 40 MG tablet Take 40 mg by mouth daily. 10/25/13   [provider]  ibuprofen (ADVIL,MOTRIN) 200 MG tablet Take 400 mg by mouth every 6 (six) hours as needed (for pain).    [provider]    Family History No family history on file.  Social History Social History   Tobacco Use  . Smoking status: Current Every Day Smoker    Packs/day: 1.00    Types: Cigarettes  Substance Use Topics  . Alcohol use: Yes    Comment: pint liqueur a day  . Drug use: Yes     Allergies   Patient has no known allergies.   Review of Systems Review of Systems  Constitutional: Negative for fever.  Gastrointestinal: Negative for nausea and vomiting.  Musculoskeletal: Positive for arthralgias and joint swelling. Negative for back pain and neck pain.  Skin: Negative for wound.  Neurological: Negative for weakness, numbness and headaches.     Physical  Exam Updated Vital Signs BP (!) 159/102   Pulse 98   Temp 98.7 F (37.1 C) (Oral)   Resp 14   Ht 5\' 4"  (1.626 m)   Wt 63.5 kg (140 lb)   SpO2 98%   BMI 24.03 kg/m   Physical Exam  Constitutional: She is oriented to person, place, and time. She appears well-developed and well-nourished.  HENT:  Head: Normocephalic and atraumatic.  Neck: Normal range of motion. Neck supple.  Cardiovascular: Normal rate.  Pulmonary/Chest: Effort normal.  Musculoskeletal: She exhibits edema and tenderness.  Patient has swelling and tenderness over the lateral malleolus of the left ankle.  There is no wounds.  No pain to the proximal fibula.  No pain to the foot.  She has normal sensation and motor function the foot.  Pedal pulses are intact.  Neurological: She is alert and oriented to person, place, and time.  Skin: Skin is warm and dry.  Psychiatric: She has a normal mood and affect.     ED Treatments / Results  Labs (all labs ordered are listed, but only abnormal results are displayed) Labs Reviewed - No data to display  EKG  EKG Interpretation None       Radiology Dg Ankle Complete Left  Result Date: 06/05/2017 CLINICAL DATA:  Fall yesterday EXAM: LEFT ANKLE COMPLETE - 3+ VIEW COMPARISON:  None. FINDINGS: Mildly displaced fracture distal  fibula just above the ankle joint. No fracture of the tibia. Ankle joint normal. Joint effusion and soft tissue swelling. IMPRESSION: Mildly displaced fracture distal fibula. Electronically Signed   By: Marlan Palauharles  Clark M.D.   On: 06/05/2017 10:00    Procedures Procedures (including critical care time)  Medications Ordered in ED Medications - No data to display   Initial Impression / Assessment and Plan / ED Course  I have reviewed the triage vital signs and the nursing notes.  Pertinent labs & imaging results that were available during my care of the patient were reviewed by me and considered in my medical decision making (see chart for  details).     Patient has evidence of a distal fibular fracture.  She is neurologically intact.  She is placed in a posterior splint.  She was advised to be nonweightbearing until she follows up with orthopedist.  She was given crutches.  She was advised in symptomatic care.  She will follow-up with [facility] as she has been seen there in the past.  Final Clinical Impressions(s) / ED Diagnoses   Final diagnoses:  Closed fracture of distal end of left fibula, unspecified fracture morphology, initial encounter    ED Discharge Orders    None       Rolan BuccoBelfi, Shahzaib Azevedo, MD 06/05/17 1017

## 2017-06-05 NOTE — ED Notes (Signed)
PMS intact before and after. Pt tolerated well. All questions answered. 

## 2017-06-13 ENCOUNTER — Encounter (HOSPITAL_BASED_OUTPATIENT_CLINIC_OR_DEPARTMENT_OTHER): Payer: Self-pay | Admitting: *Deleted

## 2017-06-13 ENCOUNTER — Other Ambulatory Visit: Payer: Self-pay

## 2017-06-13 ENCOUNTER — Emergency Department (HOSPITAL_BASED_OUTPATIENT_CLINIC_OR_DEPARTMENT_OTHER): Payer: 59

## 2017-06-13 ENCOUNTER — Emergency Department (HOSPITAL_BASED_OUTPATIENT_CLINIC_OR_DEPARTMENT_OTHER)
Admission: EM | Admit: 2017-06-13 | Discharge: 2017-06-13 | Disposition: A | Discharge status: Home / Self Care | Payer: 59 | Attending: Emergency Medicine | Admitting: Emergency Medicine

## 2017-06-13 DIAGNOSIS — F17210 Nicotine dependence, cigarettes, uncomplicated: Secondary | ICD-10-CM | POA: Insufficient documentation

## 2017-06-13 DIAGNOSIS — Z79899 Other long term (current) drug therapy: Secondary | ICD-10-CM | POA: Diagnosis not present

## 2017-06-13 DIAGNOSIS — R0789 Other chest pain: Secondary | ICD-10-CM | POA: Diagnosis not present

## 2017-06-13 DIAGNOSIS — K2100 Gastro-esophageal reflux disease with esophagitis, without bleeding: Secondary | ICD-10-CM

## 2017-06-13 DIAGNOSIS — R079 Chest pain, unspecified: Secondary | ICD-10-CM | POA: Diagnosis present

## 2017-06-13 LAB — CBC
HCT: 44.2 % (ref 36.0–46.0)
Hemoglobin: 15.5 g/dL — ABNORMAL HIGH (ref 12.0–15.0)
MCH: 34.6 pg — ABNORMAL HIGH (ref 26.0–34.0)
MCHC: 35.1 g/dL (ref 30.0–36.0)
MCV: 98.7 fL (ref 78.0–100.0)
PLATELETS: 313 10*3/uL (ref 150–400)
RBC: 4.48 MIL/uL (ref 3.87–5.11)
RDW: 11.9 % (ref 11.5–15.5)
WBC: 6.4 10*3/uL (ref 4.0–10.5)

## 2017-06-13 LAB — COMPREHENSIVE METABOLIC PANEL
ALK PHOS: 48 U/L (ref 38–126)
ALT: 18 U/L (ref 14–54)
ANION GAP: 8 (ref 5–15)
AST: 25 U/L (ref 15–41)
Albumin: 4.3 g/dL (ref 3.5–5.0)
BUN: 8 mg/dL (ref 6–20)
CALCIUM: 9.8 mg/dL (ref 8.9–10.3)
CO2: 27 mmol/L (ref 22–32)
Chloride: 99 mmol/L — ABNORMAL LOW (ref 101–111)
Creatinine, Ser: 0.7 mg/dL (ref 0.44–1.00)
GFR calc non Af Amer: >60 mL/min (ref >60)
Glucose, Bld: 100 mg/dL — ABNORMAL HIGH (ref 65–99)
Potassium: 3.8 mmol/L (ref 3.5–5.1)
SODIUM: 134 mmol/L — AB (ref 135–145)
Total Bilirubin: 0.5 mg/dL (ref 0.3–1.2)
Total Protein: 7 g/dL (ref 6.5–8.1)

## 2017-06-13 LAB — LIPASE, BLOOD: LIPASE: 30 U/L (ref 11–51)

## 2017-06-13 LAB — URINALYSIS, ROUTINE W REFLEX MICROSCOPIC
Bilirubin Urine: NEGATIVE
Glucose, UA: NEGATIVE mg/dL
Hgb urine dipstick: NEGATIVE
Ketones, ur: NEGATIVE mg/dL
LEUKOCYTES UA: NEGATIVE
Nitrite: NEGATIVE
PROTEIN: NEGATIVE mg/dL
Specific Gravity, Urine: 1.01 (ref 1.005–1.030)
pH: 6 (ref 5.0–8.0)

## 2017-06-13 LAB — TROPONIN I: Troponin I: <0.03 ng/mL (ref <0.03)

## 2017-06-13 LAB — D-DIMER, QUANTITATIVE (NOT AT ARMC): D DIMER QUANT: 0.43 ug{FEU}/mL (ref 0.00–0.50)

## 2017-06-13 NOTE — ED Provider Notes (Signed)
[facility] EMERGENCY DEPARTMENT Provider Note   CSN: [id number] Arrival date & time: 06/13/17  1118     History   Chief Complaint Chief Complaint  Patient presents with  . Chest Pain    HPI Tanya Hall is a 51 y.o. female.  Patient c/o lower/midline chest pain the occasionally radiates to back in the past couple days. Pain intermittent. Initially occurred at rest/middle of night. Pt took tums without relief. Hx reflux symptoms. Pt had fibula surgery 4 days ago after fx from twisting injury. No leg swelling. Is on lovenox.  No pleuritic pain. No hx dvt or pe. No hx cad or fam hx premature cad. No hx dvt or pe. No hx gallstones. Denies sob. No associated nv or diaphoresis.    The history is provided by the patient.  Chest Pain   Pertinent negatives include no abdominal pain, no back pain, no fever, no headaches and no shortness of breath.    History reviewed. No pertinent past medical history.  There are no active problems to display for this patient.   History reviewed. No pertinent surgical history.  OB History    No data available       Home Medications    Prior to Admission medications   Medication Sig Start Date End Date Taking? Authorizing Provider  citalopram (CELEXA) 40 MG tablet Take 40 mg by mouth daily. 10/25/13   [provider]  ibuprofen (ADVIL,MOTRIN) 200 MG tablet Take 400 mg by mouth every 6 (six) hours as needed (for pain).    [provider]    Family History No family history on file.  Social History Social History   Tobacco Use  . Smoking status: Current Every Day Smoker    Packs/day: 1.00    Types: Cigarettes  . Smokeless tobacco: Never Used  Substance Use Topics  . Alcohol use: Yes    Comment: pint liqueur a day  . Drug use: Yes     Allergies   Patient has no known allergies.   Review of Systems Review of Systems  Constitutional: Negative for fever.  HENT: Negative for sore throat.     Eyes: Negative for redness.  Respiratory: Negative for shortness of breath.   Cardiovascular: Positive for chest pain.  Gastrointestinal: Negative for abdominal pain.  Genitourinary: Positive for frequency. Negative for dysuria and flank pain.  Musculoskeletal: Negative for back pain and neck pain.  Skin: Negative for rash.  Neurological: Negative for headaches.  Hematological: Does not bruise/bleed easily.  Psychiatric/Behavioral: Negative for confusion.     Physical Exam Updated Vital Signs Ht 1.626 m (5\' 4" )   Wt 63.5 kg (140 lb)   BMI 24.03 kg/m   Physical Exam  Constitutional: She appears well-developed and well-nourished. No distress.  HENT:  Head: Atraumatic.  Eyes: Conjunctivae are normal. No scleral icterus.  Neck: Neck supple. No tracheal deviation present.  Cardiovascular: Normal rate, regular rhythm, normal heart sounds and intact distal pulses. Exam reveals no gallop and no friction rub.  No murmur heard. Pulmonary/Chest: Effort normal and breath sounds normal. No respiratory distress.  Abdominal: Soft. Normal appearance and bowel sounds are normal. She exhibits no distension. There is no tenderness.  Genitourinary:  Genitourinary Comments: No cva tenderness  Musculoskeletal: She exhibits no edema.  Neurological: She is alert.  Skin: Skin is warm and dry. No rash noted. She is not diaphoretic.  Psychiatric: She has a normal mood and affect.  Nursing note and vitals reviewed.  ED Treatments / Results  Labs (all labs ordered are listed, but only abnormal results are displayed) Results for orders placed or performed during the hospital encounter of 06/13/17  D-dimer, quantitative (not at [facility])  Result Value Ref Range   D-Dimer, Quant 0.43 0.00 - 0.50 ug/mL-FEU  Comprehensive metabolic panel  Result Value Ref Range   Sodium 134 (L) 135 - 145 mmol/L   Potassium 3.8 3.5 - 5.1 mmol/L   Chloride 99 (L) 101 - 111 mmol/L   CO2 27 22 - 32 mmol/L   Glucose,  Bld 100 (H) 65 - 99 mg/dL   BUN 8 6 - 20 mg/dL   Creatinine, Ser 1.61 0.44 - 1.00 mg/dL   Calcium 9.8 8.9 - 09.6 mg/dL   Total Protein 7.0 6.5 - 8.1 g/dL   Albumin 4.3 3.5 - 5.0 g/dL   AST 25 15 - 41 U/L   ALT 18 14 - 54 U/L   Alkaline Phosphatase 48 38 - 126 U/L   Total Bilirubin 0.5 0.3 - 1.2 mg/dL   GFR calc non Af Amer >60 >60 mL/min   GFR calc Af Amer >60 >60 mL/min   Anion gap 8 5 - 15  CBC  Result Value Ref Range   WBC 6.4 4.0 - 10.5 K/uL   RBC 4.48 3.87 - 5.11 MIL/uL   Hemoglobin 15.5 (H) 12.0 - 15.0 g/dL   HCT 04.5 40.9 - 81.1 %   MCV 98.7 78.0 - 100.0 fL   MCH 34.6 (H) 26.0 - 34.0 pg   MCHC 35.1 30.0 - 36.0 g/dL   RDW 91.4 78.2 - 95.6 %   Platelets 313 150 - 400 K/uL  Lipase, blood  Result Value Ref Range   Lipase 30 11 - 51 U/L  Urinalysis, Routine w reflex microscopic  Result Value Ref Range   Color, Urine YELLOW YELLOW   APPearance CLEAR CLEAR   Specific Gravity, Urine 1.010 1.005 - 1.030   pH 6.0 5.0 - 8.0   Glucose, UA NEGATIVE NEGATIVE mg/dL   Hgb urine dipstick NEGATIVE NEGATIVE   Bilirubin Urine NEGATIVE NEGATIVE   Ketones, ur NEGATIVE NEGATIVE mg/dL   Protein, ur NEGATIVE NEGATIVE mg/dL   Nitrite NEGATIVE NEGATIVE   Leukocytes, UA NEGATIVE NEGATIVE  Troponin I  Result Value Ref Range   Troponin I <0.03 <0.03 ng/mL   Dg Ankle Complete Left  Result Date: 06/05/2017 CLINICAL DATA:  Fall yesterday EXAM: LEFT ANKLE COMPLETE - 3+ VIEW COMPARISON:  None. FINDINGS: Mildly displaced fracture distal fibula just above the ankle joint. No fracture of the tibia. Ankle joint normal. Joint effusion and soft tissue swelling. IMPRESSION: Mildly displaced fracture distal fibula. Electronically Signed   By: Marlan Palau M.D.   On: 06/05/2017 10:00   US Abdomen Limited  Result Date: 06/13/2017 CLINICAL DATA:  Intermittent lower chest and upper abdominal pain since yesterday. EXAM: ULTRASOUND ABDOMEN LIMITED RIGHT UPPER QUADRANT COMPARISON:  CT abdomen and pelvis  08/15/2004. FINDINGS: Gallbladder: No gallstones or wall thickening visualized. No sonographic Murphy sign noted by sonographer. Common bile duct: Diameter: 0.6 cm Liver: No focal lesion identified. Within normal limits in parenchymal echogenicity. Portal vein is patent on color Doppler imaging with normal direction of blood flow towards the liver. IMPRESSION: Negative for gallstones.  Negative exam. Electronically Signed   By: Drusilla Kanner M.D.   On: 06/13/2017 13:43    EKG  EKG Interpretation  Date/Time:  Friday June 13 2017 11:28:40 EST Ventricular Rate:  64 PR Interval:  QRS Duration: 89 QT Interval:  413 QTC Calculation: 427 R Axis:   51 Text Interpretation:  Sinus rhythm Baseline wander in lead(s) II III aVF V1 Confirmed by Cathren LaineSteinl, Page Pucciarelli ([id number]) on 06/13/2017 11:40:46 AM Also confirmed by Cathren LaineSteinl, Meagan Spease ([id number]), editor Barbette Hairassel, Kerry [phone])  on 06/13/2017 12:34:25 PM       Radiology [place] Abdomen Limited  Result Date: 06/13/2017 CLINICAL DATA:  Intermittent lower chest and upper abdominal pain since yesterday. EXAM: ULTRASOUND ABDOMEN LIMITED RIGHT UPPER QUADRANT COMPARISON:  CT abdomen and pelvis 08/15/2004. FINDINGS: Gallbladder: No gallstones or wall thickening visualized. No sonographic Murphy sign noted by sonographer. Common bile duct: Diameter: 0.6 cm Liver: No focal lesion identified. Within normal limits in parenchymal echogenicity. Portal vein is patent on color Doppler imaging with normal direction of blood flow towards the liver. IMPRESSION: Negative for gallstones.  Negative exam. Electronically Signed   By: Drusilla Kannerhomas  Dalessio M.D.   On: 06/13/2017 13:43    Procedures Procedures (including critical care time)  Medications Ordered in ED Medications - No data to display   Initial Impression / Assessment and Plan / ED Course  I have reviewed the triage vital signs and the nursing notes.  Pertinent labs & imaging results that were available during my care of the  patient were reviewed by me and considered in my medical decision making (see chart for details).  Labs sent.  Reviewed nursing notes and prior charts for additional history.   After symptoms for past 2 days, trop is normal.  ddimer is also normal.  Symptoms would be exceedingly atypical for acs or pe, as pt notes episodes can last as brief as a few seconds.   Pt is on anticoag  Therapy post her recent surgery, and she also just got rx for zantac.  Feel reflux/esphagitis is most likely - rec take her zantac bid, and close pcp f/u.  Patient currently comfortable. No pain. No sob. Normal vitals.  Pt appears stable for d/c.     Final Clinical Impressions(s) / ED Diagnoses   Final diagnoses:  None    ED Discharge Orders    None       Cathren LaineSteinl, Yitzchok Carriger, MD 06/13/17 1438

## 2017-06-13 NOTE — Discharge Instructions (Signed)
It was our pleasure to provide your ER care today - we hope that you feel better.  Take your zantac 2x/day.  You may also take maalox, mylanta, or gas-x as need for symptom relief.  Follow up with primary care doctor in the coming week.  Return to ER if worse, new symptoms, fevers, persistent/recurrent chest pain, trouble breathing, other concern.

## 2017-06-13 NOTE — ED Triage Notes (Signed)
Chest pain. She had leg surgery Tuesday 2 days later she woke with pain in her chest that felt like heartburn. She took tums with no relief.

## 2019-02-17 IMAGING — DX DG ANKLE COMPLETE 3+V*L*
3 series · 3 of 3 positions shown · non-contrast
Comparison: None.

CLINICAL DATA: Fall yesterday

EXAM:
LEFT ANKLE COMPLETE - 3+ VIEW

[ankle ap]
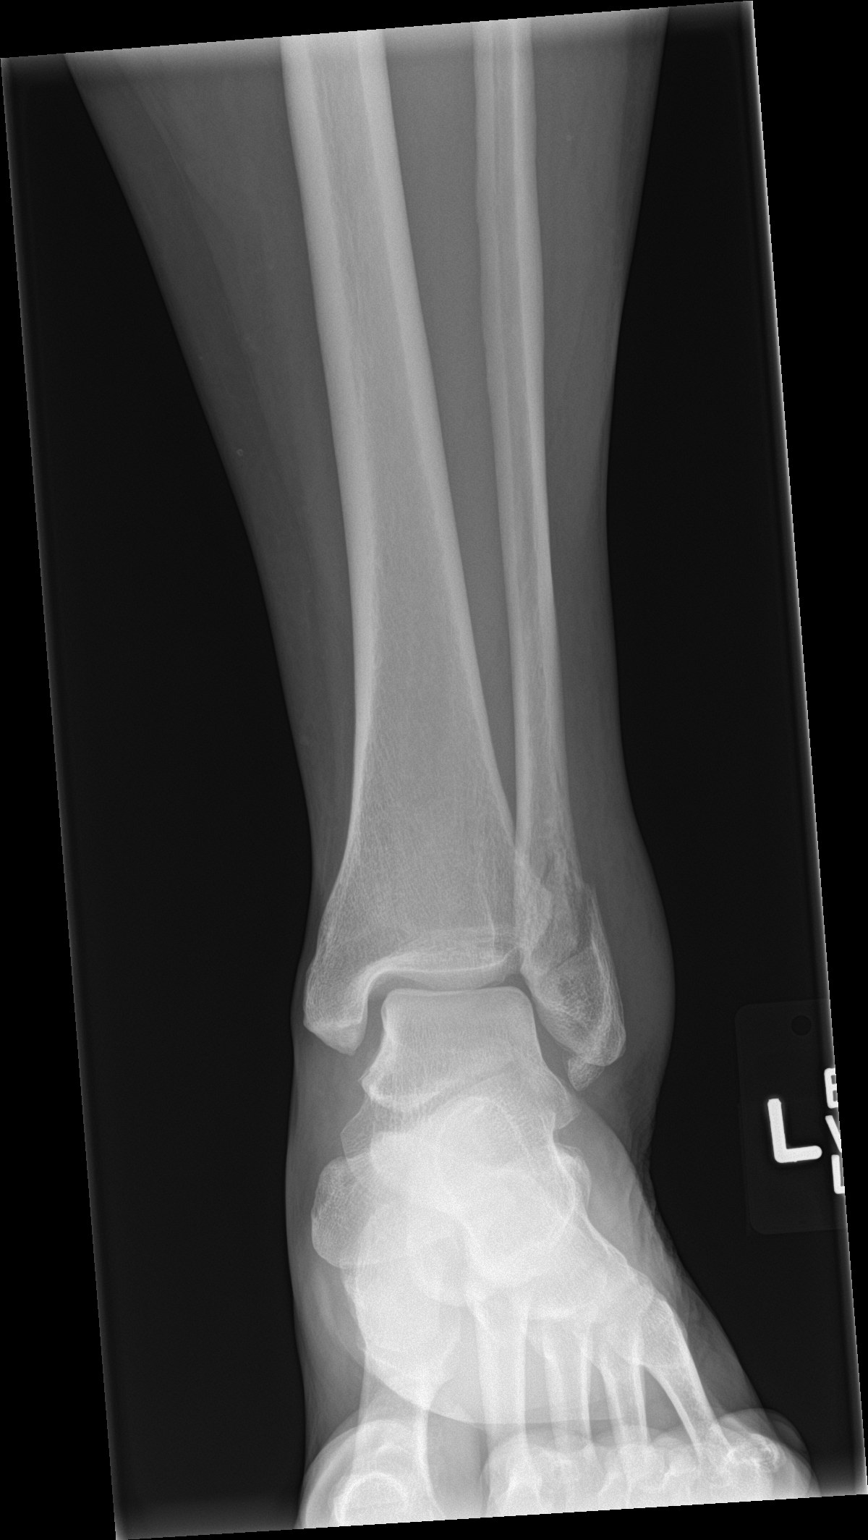

[ankle obl]
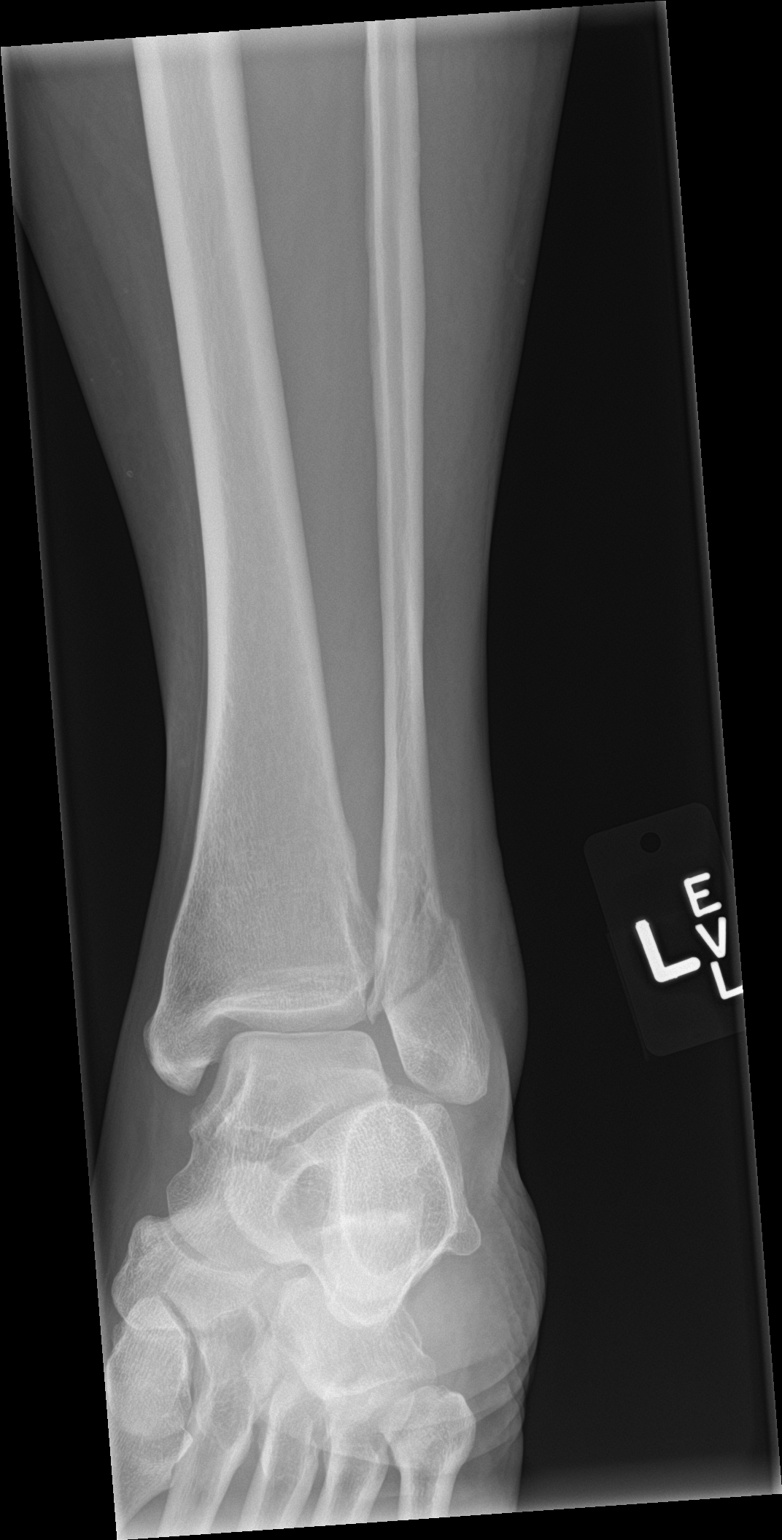

[ankle lat]
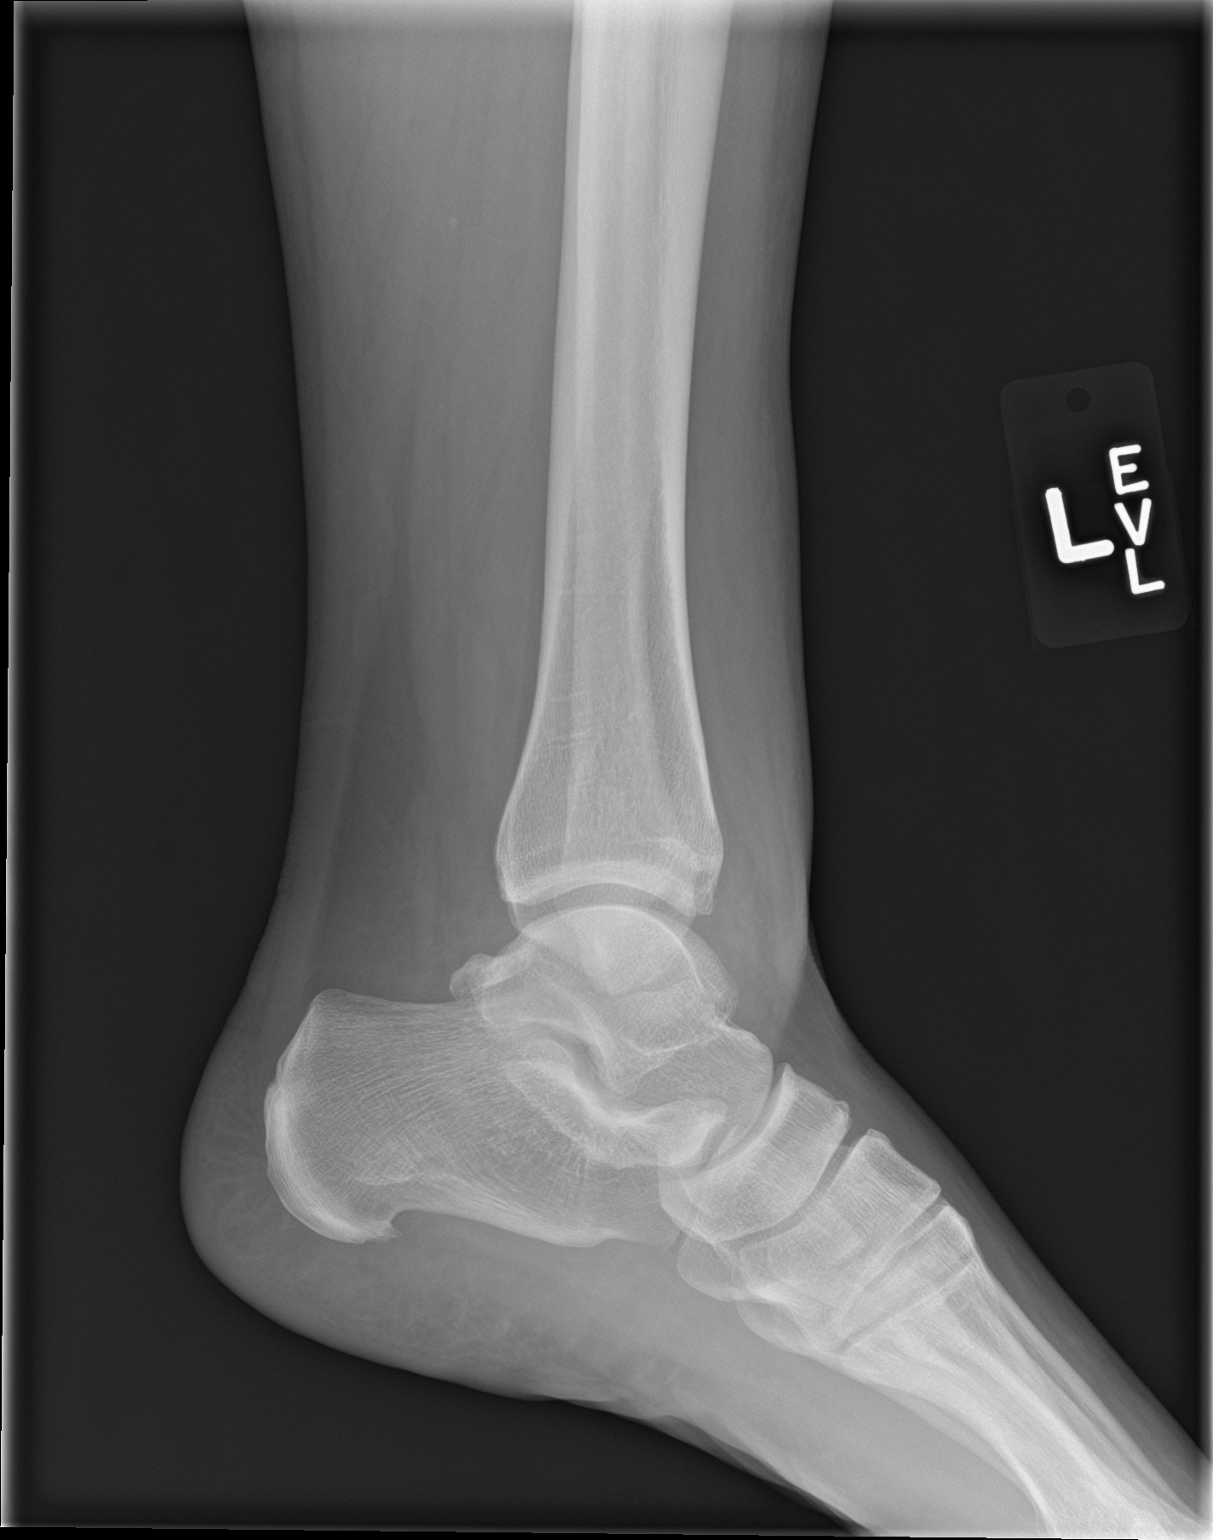

[3 of 3 positions shown; findings below may reference images not displayed]

FINDINGS: Mildly displaced fracture distal fibula just above the ankle joint.
No fracture of the tibia. Ankle joint normal. Joint effusion and
soft tissue swelling.
IMPRESSION: Mildly displaced fracture distal fibula.

## 2019-05-03 ENCOUNTER — Encounter: Payer: Self-pay | Admitting: Gastroenterology

## 2019-05-12 ENCOUNTER — Ambulatory Visit: Payer: 59 | Admitting: Gastroenterology

## 2019-05-20 ENCOUNTER — Other Ambulatory Visit: Payer: Self-pay

## 2019-05-20 DIAGNOSIS — Z20822 Contact with and (suspected) exposure to covid-19: Secondary | ICD-10-CM

## 2019-05-22 LAB — NOVEL CORONAVIRUS, NAA: SARS-CoV-2, NAA: DETECTED — AB

## 2022-09-02 ENCOUNTER — Encounter: Payer: Self-pay | Admitting: Nurse Practitioner

## 2022-10-07 ENCOUNTER — Ambulatory Visit (INDEPENDENT_AMBULATORY_CARE_PROVIDER_SITE_OTHER): Payer: 59 | Admitting: Nurse Practitioner

## 2022-10-07 ENCOUNTER — Encounter: Payer: Self-pay | Admitting: Nurse Practitioner

## 2022-10-07 VITALS — BP 136/86 | HR 87 | Ht 64.0 in | Wt 142.0 lb

## 2022-10-07 DIAGNOSIS — R194 Change in bowel habit: Secondary | ICD-10-CM

## 2022-10-07 DIAGNOSIS — Z1211 Encounter for screening for malignant neoplasm of colon: Secondary | ICD-10-CM | POA: Diagnosis not present

## 2022-10-07 MED ORDER — NA SULFATE-K SULFATE-MG SULF 17.5-3.13-1.6 GM/177ML PO SOLN: 1.0000 | Freq: Once | ORAL | 0 refills | Status: AC

## 2022-10-07 NOTE — Patient Instructions (Signed)
_______________________________________________________  If your blood pressure at your visit was 140/90 or greater, please contact your primary care physician to follow up on this.  _______________________________________________________  If you are age 57 or older, your body mass index should be between 23-30. Your Body mass index is 24.37 kg/m. If this is out of the aforementioned range listed, please consider follow up with your Primary Care Provider.  If you are age 44 or younger, your body mass index should be between 19-25. Your Body mass index is 24.37 kg/m. If this is out of the aformentioned range listed, please consider follow up with your Primary Care Provider.   You have been scheduled for a colonoscopy. Please follow written instructions given to you at your visit today.  Please pick up your prep supplies at the pharmacy within the next 1-3 days. If you use inhalers (even only as needed), please bring them with you on the day of your procedure.  Start Benefiber daily.  ________________________________________________________  The [facility] providers would like to encourage you to use [facility] to communicate with providers for non-urgent requests or questions.  Due to long hold times on the telephone, sending your provider a message by [facility] may be a faster and more efficient way to get a response.  Please allow 48 business hours for a response.  Please remember that this is for non-urgent requests.   It was a pleasure to see you today!  Thank you for trusting me with your gastrointestinal care!

## 2022-10-07 NOTE — Progress Notes (Signed)
     Assessment:   57 yo female with the following:   Colon cancer screening.   Bowel changes Stool consistency changes over the last year. Consistency varies between normal - loose and sometimes oily. Diet related? Doesn't seem med related.   GERD with pyrosis Manages symptoms with Omerpazole as needed ( a few times a week)  Plan:   -Trial of Benefiber daily.  -Schedule for a screening colonoscopy. The risks and benefits of colonoscopy with possible polypectomy / biopsies were discussed and the patient agrees to proceed.   History of Present Illness   Chief Complaint: bowel changes. Discuss having a colonoscopy   Tanya Hall is a 57 y.o. year old female new to the practice with a past medical history of anxiety, GERD.  See PMH / [place] for additional history.   Tanya Hall has not had prior colon cancer screening. No blood in stool. No Dixon of colon cancer. She has been having bowel changes over the last year. Sometimes stools are normal, other times loose. Somtimes stools are oily and different colors. She inquires about IBS. Her weight is stable   Past Medical History:  Diagnosis Date   Anxiety    History reviewed. No pertinent surgical history. No family history on file. Social History   Tobacco Use   Smoking status: Every Day    Packs/day: 1    Types: Cigarettes   Smokeless tobacco: Never  Substance Use Topics   Alcohol use: Yes    Comment: pint liqueur a day   Drug use: Yes   Current Outpatient Medications  Medication Sig Dispense Refill   escitalopram (LEXAPRO) 20 MG tablet Take 20 mg by mouth daily.     omeprazole (PRILOSEC OTC) 20 MG tablet Take 20 mg by mouth daily.     citalopram (CELEXA) 40 MG tablet Take 40 mg by mouth daily. (Patient not taking: Reported on 10/07/2022)     ibuprofen (ADVIL,MOTRIN) 200 MG tablet Take 400 mg by mouth every 6 (six) hours as needed (for pain). (Patient not taking: Reported on 10/07/2022)     No current  facility-administered medications for this visit.   No Known Allergies   Review of Systems: Positive for muscle pain / cramps, night sweats, skin rash, urine leakage.  All other systems reviewed and negative except where noted in HPI.   Wt Readings from Last 3 Encounters:  10/07/22 142 lb (64.4 kg)  06/13/17 140 lb (63.5 kg)  06/05/17 140 lb (63.5 kg)    Physical Exam:  Ht 5\' 4"  (1.626 m)   Wt 142 lb (64.4 kg)   BMI 24.37 kg/m  Constitutional:  Pleasant, generally well appearing female in no acute distress. Psychiatric:  Normal mood and affect. Behavior is normal. EENT: Pupils normal.  Conjunctivae are normal. No scleral icterus. Neck supple.  Cardiovascular: Normal rate, regular rhythm.  Pulmonary/chest: Effort normal and breath sounds normal. No wheezing, rales or rhonchi. Abdominal: Soft, nondistended, nontender. Bowel sounds active throughout. There are no masses palpable. No hepatomegaly. Neurological: Alert and oriented to person place and time. Skin: Skin is warm and dry. No rashes noted.  Tye Savoy, NP  10/07/2022, 11:36 AM

## 2022-10-22 ENCOUNTER — Telehealth: Payer: Self-pay | Admitting: Nurse Practitioner

## 2022-10-22 NOTE — Telephone Encounter (Signed)
Inbound call from patient, requesting to reschedule her procedure. She rescheduled for 5/24 at 9:00 AM. She would like updated instructions.

## 2022-10-23 NOTE — Telephone Encounter (Signed)
Patient has been sent new instructions.

## 2022-10-29 NOTE — Progress Notes (Signed)
Reviewed and agree with documentation and assessment and plan. K. Veena Kaisen Ackers , MD   

## 2022-11-08 ENCOUNTER — Encounter: Payer: 59 | Admitting: Gastroenterology

## 2022-11-15 ENCOUNTER — Encounter: Payer: 59 | Admitting: Gastroenterology

## 2022-11-26 ENCOUNTER — Encounter: Payer: Self-pay | Admitting: Gastroenterology

## 2022-12-05 ENCOUNTER — Telehealth: Payer: Self-pay | Admitting: Gastroenterology

## 2022-12-05 NOTE — Telephone Encounter (Signed)
Patient calling to cancel procedure for tomorrow 5/24 at 9:00 due to her dad being admitted to the hospital last night. Patient will call back to reschedule when she is able.

## 2022-12-05 NOTE — Telephone Encounter (Signed)
ok 

## 2022-12-06 ENCOUNTER — Encounter: Payer: 59 | Admitting: Gastroenterology

## 2023-12-16 ENCOUNTER — Encounter: Payer: Self-pay | Admitting: Obstetrics

## 2023-12-16 ENCOUNTER — Other Ambulatory Visit: Payer: Self-pay | Admitting: Obstetrics

## 2023-12-16 DIAGNOSIS — N6310 Unspecified lump in the right breast, unspecified quadrant: Secondary | ICD-10-CM

## 2023-12-30 ENCOUNTER — Ambulatory Visit
Admission: RE | Admit: 2023-12-30 | Discharge: 2023-12-30 | Disposition: A | Discharge status: Home / Self Care | Source: Ambulatory Visit | Attending: Obstetrics | Admitting: Obstetrics

## 2023-12-30 DIAGNOSIS — N6310 Unspecified lump in the right breast, unspecified quadrant: Secondary | ICD-10-CM

## 2024-03-15 ENCOUNTER — Ambulatory Visit (HOSPITAL_COMMUNITY)
Admission: EM | Admit: 2024-03-15 | Discharge: 2024-03-16 | Disposition: A | Discharge status: Home / Self Care | Attending: Nurse Practitioner | Admitting: Nurse Practitioner

## 2024-03-15 DIAGNOSIS — F10129 Alcohol abuse with intoxication, unspecified: Secondary | ICD-10-CM | POA: Insufficient documentation

## 2024-03-15 DIAGNOSIS — R45851 Suicidal ideations: Secondary | ICD-10-CM | POA: Insufficient documentation

## 2024-03-15 DIAGNOSIS — Z7689 Persons encountering health services in other specified circumstances: Secondary | ICD-10-CM
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
## 2024-03-16 DIAGNOSIS — F10129 Alcohol abuse with intoxication, unspecified: Secondary | ICD-10-CM

## 2024-03-16 DIAGNOSIS — Z7689 Persons encountering health services in other specified circumstances: Secondary | ICD-10-CM | POA: Diagnosis not present

## 2024-03-16 DIAGNOSIS — R45851 Suicidal ideations: Secondary | ICD-10-CM

## 2024-03-16 LAB — COMPREHENSIVE METABOLIC PANEL WITH GFR
ALT: 34 U/L (ref 0–44)
AST: 35 U/L (ref 15–41)
Albumin: 4.4 g/dL (ref 3.5–5.0)
Alkaline Phosphatase: 55 U/L (ref 38–126)
Anion gap: 16 — ABNORMAL HIGH (ref 5–15)
BUN: <5 mg/dL — ABNORMAL LOW (ref 6–20)
CO2: 21 mmol/L — ABNORMAL LOW (ref 22–32)
Calcium: 9.2 mg/dL (ref 8.9–10.3)
Chloride: 102 mmol/L (ref 98–111)
Creatinine, Ser: 0.53 mg/dL (ref 0.44–1.00)
GFR, Estimated: >60 mL/min (ref >60)
Glucose, Bld: 83 mg/dL (ref 70–99)
Potassium: 3.8 mmol/L (ref 3.5–5.1)
Sodium: 139 mmol/L (ref 135–145)
Total Bilirubin: 0.5 mg/dL (ref 0.0–1.2)
Total Protein: 7 g/dL (ref 6.5–8.1)

## 2024-03-16 LAB — POCT URINE DRUG SCREEN - MANUAL ENTRY (I-SCREEN)
POC Amphetamine UR: NOT DETECTED
POC Buprenorphine (BUP): NOT DETECTED
POC Cocaine UR: NOT DETECTED
POC Marijuana UR: NOT DETECTED
POC Methadone UR: NOT DETECTED
POC Methamphetamine UR: NOT DETECTED
POC Morphine: NOT DETECTED
POC Oxazepam (BZO): NOT DETECTED
POC Oxycodone UR: NOT DETECTED
POC Secobarbital (BAR): NOT DETECTED

## 2024-03-16 LAB — LIPID PANEL
Cholesterol: 266 mg/dL — ABNORMAL HIGH (ref 0–200)
HDL: 133 mg/dL (ref >40)
LDL Cholesterol: 103 mg/dL — ABNORMAL HIGH (ref 0–99)
Total CHOL/HDL Ratio: 2 ratio
Triglycerides: 148 mg/dL (ref <150)
VLDL: 30 mg/dL (ref 0–40)

## 2024-03-16 LAB — CBC WITH DIFFERENTIAL/PLATELET
Abs Immature Granulocytes: 0.01 K/uL (ref 0.00–0.07)
Basophils Absolute: 0 K/uL (ref 0.0–0.1)
Basophils Relative: 1 %
Eosinophils Absolute: 0.1 K/uL (ref 0.0–0.5)
Eosinophils Relative: 3 %
HCT: 46.1 % — ABNORMAL HIGH (ref 36.0–46.0)
Hemoglobin: 16.1 g/dL — ABNORMAL HIGH (ref 12.0–15.0)
Immature Granulocytes: 0 %
Lymphocytes Relative: 51 %
Lymphs Abs: 2.4 K/uL (ref 0.7–4.0)
MCH: 34.8 pg — ABNORMAL HIGH (ref 26.0–34.0)
MCHC: 34.9 g/dL (ref 30.0–36.0)
MCV: 99.8 fL (ref 80.0–100.0)
Monocytes Absolute: 0.5 K/uL (ref 0.1–1.0)
Monocytes Relative: 10 %
Neutro Abs: 1.6 K/uL — ABNORMAL LOW (ref 1.7–7.7)
Neutrophils Relative %: 35 %
Platelets: 156 K/uL (ref 150–400)
RBC: 4.62 MIL/uL (ref 3.87–5.11)
RDW: 12.4 % (ref 11.5–15.5)
WBC: 4.6 K/uL (ref 4.0–10.5)
nRBC: 0 % (ref 0.0–0.2)

## 2024-03-16 LAB — POC URINE PREG, ED: Preg Test, Ur: NEGATIVE

## 2024-03-16 LAB — ETHANOL: Alcohol, Ethyl (B): 135 mg/dL — ABNORMAL HIGH (ref <15)

## 2024-03-16 LAB — TSH: TSH: 1.274 u[IU]/mL (ref 0.350–4.500)

## 2024-03-16 MED ORDER — CHLORDIAZEPOXIDE HCL 25 MG PO CAPS: 25.0000 mg | ORAL_CAPSULE | Freq: Four times a day (QID) | ORAL | Status: DC | PRN

## 2024-03-16 MED ORDER — ALUM & MAG HYDROXIDE-SIMETH 200-200-20 MG/5ML PO SUSP: 30.0000 mL | ORAL | Status: DC | PRN

## 2024-03-16 MED ORDER — HYDROXYZINE HCL 25 MG PO TABS: 25.0000 mg | ORAL_TABLET | Freq: Four times a day (QID) | ORAL | Status: DC | PRN

## 2024-03-16 MED ORDER — OLANZAPINE 5 MG PO TBDP: 5.0000 mg | ORAL_TABLET | Freq: Three times a day (TID) | ORAL | Status: DC | PRN

## 2024-03-16 MED ORDER — OLANZAPINE 10 MG IM SOLR: 5.0000 mg | Freq: Three times a day (TID) | INTRAMUSCULAR | Status: DC | PRN

## 2024-03-16 MED ORDER — THIAMINE HCL 100 MG/ML IJ SOLN
100.0000 mg | Freq: Once | INTRAMUSCULAR | Status: AC
  Administered 2024-03-16: 100 mg via INTRAMUSCULAR
  Filled 2024-03-16: qty 2

## 2024-03-16 MED ORDER — CHLORDIAZEPOXIDE HCL 25 MG PO CAPS: 25.0000 mg | ORAL_CAPSULE | Freq: Every day | ORAL | Status: DC

## 2024-03-16 MED ORDER — MAGNESIUM HYDROXIDE 400 MG/5ML PO SUSP: 30.0000 mL | Freq: Every day | ORAL | Status: DC | PRN

## 2024-03-16 MED ORDER — CHLORDIAZEPOXIDE HCL 25 MG PO CAPS: 25.0000 mg | ORAL_CAPSULE | ORAL | Status: DC

## 2024-03-16 MED ORDER — CHLORDIAZEPOXIDE HCL 25 MG PO CAPS
25.0000 mg | ORAL_CAPSULE | Freq: Once | ORAL | Status: AC
  Administered 2024-03-16: 25 mg via ORAL
  Filled 2024-03-16: qty 1

## 2024-03-16 MED ORDER — ACETAMINOPHEN 325 MG PO TABS: 650.0000 mg | ORAL_TABLET | Freq: Four times a day (QID) | ORAL | Status: DC | PRN

## 2024-03-16 MED ORDER — HYDROXYZINE HCL 25 MG PO TABS: 25.0000 mg | ORAL_TABLET | Freq: Four times a day (QID) | ORAL | 0 refills | Status: AC | PRN

## 2024-03-16 MED ORDER — CHLORDIAZEPOXIDE HCL 25 MG PO CAPS: 25.0000 mg | ORAL_CAPSULE | Freq: Three times a day (TID) | ORAL | Status: DC

## 2024-03-16 MED ORDER — CHLORDIAZEPOXIDE HCL 25 MG PO CAPS
25.0000 mg | ORAL_CAPSULE | Freq: Four times a day (QID) | ORAL | Status: DC
  Administered 2024-03-16: 25 mg via ORAL
  Filled 2024-03-16: qty 1

## 2024-03-16 MED ORDER — ADULT MULTIVITAMIN W/MINERALS CH
1.0000 | ORAL_TABLET | Freq: Every day | ORAL | Status: DC
  Administered 2024-03-16: 1 via ORAL
  Filled 2024-03-16: qty 1

## 2024-03-16 MED ORDER — ONDANSETRON 4 MG PO TBDP: 4.0000 mg | ORAL_TABLET | Freq: Four times a day (QID) | ORAL | Status: DC | PRN

## 2024-03-16 MED ORDER — LOPERAMIDE HCL 2 MG PO CAPS: 2.0000 mg | ORAL_CAPSULE | ORAL | Status: DC | PRN

## 2024-03-16 MED ORDER — TRAZODONE HCL 50 MG PO TABS: 50.0000 mg | ORAL_TABLET | Freq: Every evening | ORAL | Status: DC | PRN
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
## 2024-03-16 NOTE — Discharge Instructions (Addendum)
  Base on the information you have provided and the presenting issue, outpatient services and resources for have been recommended.  It is imperative that you follow through with treatment recommendations within 5-7 days from the of discharge to mitigate further risk to your safety and mental well-being. A list of referrals has been provided below to get you started.  You are not limited to the list provided.  In case of an urgent crisis, you may contact the Mobile Crisis Unit with [facility] at [phone].                            [facility]    Patient has a intake appointment with Darice Simpler at [address] on the 2nd floor in [place] on on 03-22-24 at 1pm.     Specialized Group Therapy for Substance Abuse If you have a substance abuse disorder (with or without a mental health condition), you may benefit from specialized substance abuse therapy in a group setting. According to discharge survey data, 70 percent of patients completing our chemical dependency intensive outpatient program report fewer symptoms of substance abuse and incidents of relapse.  Under the guidance of a licensed mental health professional, meet with your peers every Monday, Wednesday and Friday from 9 a.m. to 12 p.m. for 6 to 8 weeks to:  Learn about chemical dependency, mental illness and co-occurring disorders. Develop relapse-prevention skills. Set personalized goals with your treatment team. To build on the skills you gain, you can attend Alcoholics Anonymous or Narcotics Anonymous meetings in the evenings and access follow-up care through weekly group meetings with peers. Ongoing support promotes wellness and recovery.  For more information, call Darice Simpler, LCAS or  Zell Maier, [place] at [phone]. We work directly with employers and families to ensure you receive the care you need.

## 2024-03-16 NOTE — ED Notes (Signed)
 Patient A&Ox4. Denies intent/thoughts to harm self/others when asked. Reports that her father passed away this year and has been taking care of her mother, whom has dementia, and has issues going on at home. Reports that all of her issues just blew up and she made statements she didn't mean. Denies A/VH. Patient denies any physical complaints when asked. No distress noted. Support and encouragement provided. Routine safety checks conducted according to facility protocol. Encouraged patient to notify staff if thoughts of harm toward self or others arise. Endorses safety. Patient verbalized understanding and agreement. Plan of care ongoing, no further concerns as of present. Patient expresses no other needs at this time.

## 2024-03-16 NOTE — ED Notes (Signed)
 Pt is in the courtyard with the provider

## 2024-03-16 NOTE — ED Notes (Signed)
 Patient resting with eyes closed. Respirations even and unlabored. No distress noted. Environment secured. Plan of care ongoing, no further concerns as of present.
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
## 2024-03-22 ENCOUNTER — Encounter (HOSPITAL_COMMUNITY): Payer: Self-pay

## 2024-03-22 ENCOUNTER — Ambulatory Visit (INDEPENDENT_AMBULATORY_CARE_PROVIDER_SITE_OTHER): Admitting: Licensed Clinical Social Worker

## 2024-03-22 DIAGNOSIS — F39 Unspecified mood [affective] disorder: Secondary | ICD-10-CM

## 2024-03-22 DIAGNOSIS — F1020 Alcohol dependence, uncomplicated: Secondary | ICD-10-CM | POA: Diagnosis not present
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
## 2024-03-26 ENCOUNTER — Telehealth (HOSPITAL_COMMUNITY): Payer: Self-pay | Admitting: Licensed Clinical Social Worker

## 2024-03-26 NOTE — Telephone Encounter (Signed)
 Therapist returns's Telephone Call Leaving a HIPAA Compliant Voicemail.  Zell Maier, MA, LCSW, [facility], LCAS 03/26/2024

## 2024-03-30 ENCOUNTER — Telehealth (HOSPITAL_COMMUNITY): Payer: Self-pay | Admitting: Licensed Clinical Social Worker

## 2024-03-30 NOTE — Telephone Encounter (Signed)
 The therapist receives a voicemail from [place] on 03/25/24 at 5:39 p.m. in which she states, I was really hoping you were going to help me, but you're not. She does not leave her name or number; however, the therapist is able to confirm that the call came from [place] via the number from which the call was placed. The therapist returns her call leaving a HIPAA-compliant voicemail explaining that this therapist did not receive this message until today as, per his voicemail, this therapist is only at this number on Tuesdays and Thursdays with his callback number on Mondays, Wednesdays, and Fridays being [phone]. Additionally, the therapist notes that he left her a voicemail on 03/26/24 and had not received a response to this.   The therapist indicates that he received her email from yesterday and needs to speak with her to obtain some clarification such that he can complete this request.   The therapist receives the following email from [place] yesterday afternoon:   I have to fill out the attachment but if you can send the asked for letter....[email or web address]. Delsie offered to sign any needed doctor information with  the needed information below. Please only state mental health therapy. If you have any questions, let me  know and please let me know when it has been sent.    Sent: Wednesday, March 24, 2024 10:28:34 AM To: Trippett,Meyah ([employer]) BIP-US -[email or web address]> Subject: Accommodation confidential    Hi, Zacaria.   Nice to meet and work with you.  This is Channing the Acupuncturist' RN Case Manager.  I'll be your contact for obtaining authorization for time needed to attend doctor's appointments and treatments.  This process is called a Diplomatic Services operational officer.   Renee Ryken and I have been in contact. Your work needs were discussed.  Linley, all information stays secure within Occupational Health.   I need, Your completion and return of  the Request For Accommodation Form, attached. Doctor. Letter from your doctor requesting your specific work needs. To include, any days, time, duration of those needs. This info. is sent to me. Fax# [phone]. Example, Only;  Susie should work 3 half days/week for 6 weeks.  Catherines next appointment is on ---.   Once received, I'll move forward with the accommodation process.     Take good care of yourself. Respectfully, Channing Peter Benton, MA, LCSW, [facility], LCAS 03/30/2024

## 2024-03-30 NOTE — Telephone Encounter (Signed)
 I was really hoping you would help me but you're not
